# Patient Record
Sex: Female | Born: 1980 | Race: Black or African American | Hispanic: No | Marital: Married | Smoking: Current every day smoker
Health system: Southern US, Community
[De-identification: ages and names within clinical notes are randomized; demographics above are authoritative.]

## PROBLEM LIST (undated history)

## (undated) DIAGNOSIS — L0100 Impetigo, unspecified: Secondary | ICD-10-CM

---

## 2000-10-17 ENCOUNTER — Emergency Department (HOSPITAL_COMMUNITY): Admission: EM | Admit: 2000-10-17 | Discharge: 2000-10-17 | Payer: Self-pay | Admitting: Emergency Medicine

## 2009-09-26 ENCOUNTER — Emergency Department (HOSPITAL_COMMUNITY): Admission: EM | Admit: 2009-09-26 | Discharge: 2009-09-26 | Payer: Self-pay | Admitting: Emergency Medicine

## 2010-12-28 ENCOUNTER — Ambulatory Visit (INDEPENDENT_AMBULATORY_CARE_PROVIDER_SITE_OTHER): Payer: Self-pay

## 2010-12-28 ENCOUNTER — Inpatient Hospital Stay (INDEPENDENT_AMBULATORY_CARE_PROVIDER_SITE_OTHER)
Admission: RE | Admit: 2010-12-28 | Discharge: 2010-12-28 | Disposition: A | Discharge status: Home / Self Care | Payer: Self-pay | Source: Ambulatory Visit | Attending: Family Medicine | Admitting: Family Medicine

## 2010-12-28 ENCOUNTER — Emergency Department (HOSPITAL_COMMUNITY)
Admission: EM | Admit: 2010-12-28 | Discharge: 2010-12-28 | Disposition: A | Discharge status: Home / Self Care | Payer: Self-pay | Attending: Emergency Medicine | Admitting: Emergency Medicine

## 2010-12-28 DIAGNOSIS — R5383 Other fatigue: Secondary | ICD-10-CM | POA: Insufficient documentation

## 2010-12-28 DIAGNOSIS — K5900 Constipation, unspecified: Secondary | ICD-10-CM

## 2010-12-28 DIAGNOSIS — R599 Enlarged lymph nodes, unspecified: Secondary | ICD-10-CM

## 2010-12-28 DIAGNOSIS — R5381 Other malaise: Secondary | ICD-10-CM | POA: Insufficient documentation

## 2010-12-28 DIAGNOSIS — R197 Diarrhea, unspecified: Secondary | ICD-10-CM | POA: Insufficient documentation

## 2010-12-28 DIAGNOSIS — R634 Abnormal weight loss: Secondary | ICD-10-CM | POA: Insufficient documentation

## 2010-12-28 DIAGNOSIS — R61 Generalized hyperhidrosis: Secondary | ICD-10-CM | POA: Insufficient documentation

## 2010-12-28 DIAGNOSIS — R109 Unspecified abdominal pain: Secondary | ICD-10-CM | POA: Insufficient documentation

## 2010-12-28 LAB — CBC
HCT: 36.3 % (ref 36.0–46.0)
MCHC: 33.1 g/dL (ref 30.0–36.0)
MCV: 90.1 fL (ref 78.0–100.0)
Platelets: 184 10*3/uL (ref 150–400)
RDW: 13 % (ref 11.5–15.5)
WBC: 6.8 10*3/uL (ref 4.0–10.5)

## 2010-12-28 LAB — DIFFERENTIAL
Eosinophils Absolute: 0.2 10*3/uL (ref 0.0–0.7)
Eosinophils Relative: 3 % (ref 0–5)
Lymphocytes Relative: 30 % (ref 12–46)
Lymphs Abs: 2.1 10*3/uL (ref 0.7–4.0)
Monocytes Absolute: 0.5 10*3/uL (ref 0.1–1.0)

## 2010-12-31 LAB — URINE CULTURE: Colony Count: 75000

## 2010-12-31 LAB — URINALYSIS, ROUTINE W REFLEX MICROSCOPIC
Bilirubin Urine: NEGATIVE
Ketones, ur: NEGATIVE mg/dL
Nitrite: NEGATIVE
Protein, ur: NEGATIVE mg/dL
Specific Gravity, Urine: 1.014 (ref 1.005–1.030)
Urobilinogen, UA: 0.2 mg/dL (ref 0.0–1.0)

## 2010-12-31 LAB — POCT PREGNANCY, URINE: Preg Test, Ur: NEGATIVE

## 2010-12-31 LAB — URINE MICROSCOPIC-ADD ON

## 2017-01-22 ENCOUNTER — Emergency Department (HOSPITAL_COMMUNITY)
Admission: EM | Admit: 2017-01-22 | Discharge: 2017-01-22 | Disposition: A | Discharge status: Home / Self Care | Payer: Self-pay | Attending: Emergency Medicine | Admitting: Emergency Medicine

## 2017-01-22 ENCOUNTER — Encounter (HOSPITAL_COMMUNITY): Payer: Self-pay | Admitting: Emergency Medicine

## 2017-01-22 DIAGNOSIS — R198 Other specified symptoms and signs involving the digestive system and abdomen: Secondary | ICD-10-CM

## 2017-01-22 DIAGNOSIS — F17210 Nicotine dependence, cigarettes, uncomplicated: Secondary | ICD-10-CM | POA: Insufficient documentation

## 2017-01-22 DIAGNOSIS — R194 Change in bowel habit: Secondary | ICD-10-CM | POA: Insufficient documentation

## 2017-01-22 DIAGNOSIS — L0100 Impetigo, unspecified: Secondary | ICD-10-CM | POA: Insufficient documentation

## 2017-01-22 HISTORY — DX: Impetigo, unspecified: L01.00

## 2017-01-22 LAB — POC OCCULT BLOOD, ED: FECAL OCCULT BLD: NEGATIVE

## 2017-01-22 MED ORDER — DOXYCYCLINE HYCLATE 100 MG PO CAPS: 100.0000 mg | ORAL_CAPSULE | Freq: Two times a day (BID) | ORAL | 0 refills | Status: AC

## 2017-01-22 MED ORDER — MUPIROCIN CALCIUM 2 % NA OINT: TOPICAL_OINTMENT | NASAL | 0 refills | Status: AC

## 2017-01-22 NOTE — ED Triage Notes (Signed)
Pt states rash on left side of nare and states same rash to buttocks.  Pt states hx of impetigo and "this feels similar'

## 2017-01-22 NOTE — ED Provider Notes (Signed)
[facility]-EMERGENCY DEPT Provider Note   CSN: [id number] Arrival date & time: 01/22/17  1220   By signing my name below, I, Tina Doyle, attest that this documentation has been prepared under the direction and in the presence of Sharen Heck, PA-C. Electronically Signed: Clarisse Doyle, Scribe. 01/22/17. 5:05 PM.   History   Chief Complaint Chief Complaint  Patient presents with  . Rash   The history is provided by the patient and medical records. No language interpreter was used.    Tina Doyle is a 36 y.o. female with h/o impetigo, transported via private vehicle to the Emergency Department with concern for recurring red, non-raised areas of skin onset 2 days ago. Pt notes affected area of skin to the L side of the nose described as itchy, buring and tingly. She further states the first flare up was in 2009, successfully treated with abx and ointments and recurring approx every 9 months; last flare up ~2 years ago. Pt notes current affected area of skin is consistent with past impetigo symptoms. No rhinorrhea, other medical disorders or daily medications. No PCP; no dermatologist.  She also expresses concern for a painful discomfort in the rectum that feels "wiggly", as though something is moving around inside it. She also reports a white, elastic "worm" "coiled" up her stool this morning. No N/V/D, bloody stool, mucus in stool, difficulty urinating, pelvic pain, unexpected weight loss, sick contacts, recent travel, suspicious food intake.  No known household members with similar symptoms. No noctural anal itching.  Currently on menstrual cycle. On depo x 2 months.  Past Medical History:  Diagnosis Date  . Impetigo     There are no active problems to display for this patient.   History reviewed. No pertinent surgical history.  OB History    No data available       Home Medications    Prior to Admission medications   Medication Sig Start Date End Date Taking? Authorizing  Provider  doxycycline (VIBRAMYCIN) 100 MG capsule Take 1 capsule (100 mg total) by mouth 2 (two) times daily. 01/22/17 01/27/17  Liberty Handy, PA-C  mupirocin nasal ointment (BACTROBAN) 2 % Apply in each nostril daily 01/22/17 01/27/17  Liberty Handy, PA-C    Family History No family history on file.  Social History Social History  Substance Use Topics  . Smoking status: Current Every Day Smoker    Types: Cigarettes  . Smokeless tobacco: Never Used  . Alcohol use No     Allergies   Morphine and related   Review of Systems Review of Systems  HENT: Negative for rhinorrhea.   Gastrointestinal: Positive for rectal pain. Negative for abdominal pain, blood in stool, constipation, diarrhea, nausea and vomiting.  Genitourinary: Negative for difficulty urinating and pelvic pain.  Skin: Positive for rash.  All other systems reviewed and are negative.    Physical Exam Updated Vital Signs BP 116/81 (BP Location: Left Arm)   Pulse 81   Temp 99.1 F (37.3 C) (Oral)   Resp 16   LMP 01/17/2017   SpO2 100%   Physical Exam  Constitutional: She is oriented to person, place, and time. She appears well-developed and well-nourished.  HENT:  Head: Normocephalic and atraumatic.  Eyes: Conjunctivae are normal. Pupils are equal, round, and reactive to light. Right eye exhibits no discharge. Left eye exhibits no discharge. No scleral icterus.  Neck: Normal range of motion. No JVD present. No tracheal deviation present.  Pulmonary/Chest: Effort normal. No stridor.  Genitourinary:  Pelvic exam was performed with patient in the knee-chest position.  Genitourinary Comments: Chaperone was present.  Patient with appropriate discomfort in perianal/rectal area with palpation.  There are no external fissures or external hemorrhoids noted. No induration or swelling of the perianal skin.   I was able to feel the first 3-4cm of the rectum digitally without gross abnormalities or masses.  Stool  color is brown with no gross blood noted.   No signs of perirectal abscess.   DRE reveals good sphincter tone.    Neurological: She is alert and oriented to person, place, and time. Coordination normal.  Skin: Rash noted.  Tender, mildly erythematous, moist rash to the L nare with honey crusting; no surrounding signs of cellulitis of abscess; bilateral nasal passages normal  Psychiatric: She has a normal mood and affect. Her behavior is normal. Judgment and thought content normal.  Nursing note and vitals reviewed.    ED Treatments / Results  DIAGNOSTIC STUDIES: Oxygen Saturation is 100% on RA, NL by my interpretation.    COORDINATION OF CARE: 2:22 PM-Discussed next steps with pt. Pt verbalized understanding and is agreeable with the plan. Rectal exam performed with female chaperone present. Will order medications and labs, then prepare for d/c.   Labs (all labs ordered are listed, but only abnormal results are displayed) Labs Reviewed  POC OCCULT BLOOD, ED    EKG  EKG Interpretation Doyle       Radiology No results found.  Procedures Procedures (including critical care time)  Medications Ordered in ED Medications - No data to display   Initial Impression / Assessment and Plan / ED Course  I have reviewed the triage vital signs and the nursing notes.  Pertinent labs & imaging results that were available during my care of the patient were reviewed by me and considered in my medical decision making (see chart for details).  Clinical Course as of Jan 23 1704  Wed Jan 22, 2017  1442 Fecal Occult Blood, POC: NEGATIVE [CG]  1442 Temp: 99.1 F (37.3 C) [CG]  1442 Pulse Rate: 81 [CG]  1442 Resp: 16 [CG]  1442 SpO2: 100 % [CG]    Clinical Course User Index [CG] Liberty Handy, PA-C   36 year old healthy female presents with two concerns.   Patient reports burning rash to the left nare similar to her previous episodes of impetigo. Patient started having these  outbreaks in 2009 and gets them every 9-10 months, in the same area, symptoms usually resolve with oral and topical antibiotics. On exam vital signs are within normal limits without fever patient is well-appearing. There is a tiny area of erythematous, moist rash with honey crust covering consistent with impetigo. No surrounding signs of cellulitis or abscess. Will discharge patient with oral antibiotic and ointment as these have worked for her in the past. Home care instructions given.  Patient also reported concern with recent bowel movement this AM.  She reports seeing a "white worm" in her stool this morning. Patient describes this as a white, string-like, coiled worm approximately 10-12 cm long. She also reports anal discomfort described as "something wiggling in there".  No recent travel, exposure to suspicious food, contact with patients with similar symptoms. No bloody or mucoid BMs. No abdominal pain, n/v/d/c.  No nocturnal anal pruritus.  On exam abdomen is benign. Digital rectal exam did not reveal any abnormalities. Hemoccult negative for blood. Unclear etiology to this presentation. Patient is completely asymptomatic otherwise without any systemic symptoms that would raise  concern or require emergent lab work, imaging or admission immediately. I discussed medical decision-making with patient, I recommended that she establish care with a primary care provider in the area and that she makes an appointment for further discussion of her symptoms. Patient was advised to monitor her BMs and to look for any additional signs of abnormalities. Patient likely needs a stool sample and additional workup to rule out parasites. Patient does not have a primary care provider health insurance, I provided her information for [facility] so she is able to establish care with a primary care provider there. Patient verbalized understanding and is agreeable to plan. Patient will try to call clinic today to  make an appointment in the next 1-2 days for further evaluation. Strict ED return precautions given. Patient is aware of red flag symptoms to monitor for that would warrant return to the emergency department.  Patient, ED treatment and discharge plan was discussed with supervising physician who is agreeable with plan.   Final Clinical Impressions(s) / ED Diagnoses   Final diagnoses:  Impetigo  Abnormal bowel movement    New Prescriptions Discharge Medication List as of 01/22/2017  3:32 PM    START taking these medications   Details  doxycycline (VIBRAMYCIN) 100 MG capsule Take 1 capsule (100 mg total) by mouth 2 (two) times daily., Starting Wed 01/22/2017, Until Mon 01/27/2017, Print    mupirocin nasal ointment (BACTROBAN) 2 % Apply in each nostril daily, Print       I personally performed the services described in this documentation, which was scribed in my presence. The recorded information has been reviewed and is accurate.    Liberty Handy, PA-C 01/22/17 1706    Alvira Monday, MD 01/22/17 [phone]

## 2017-01-22 NOTE — ED Notes (Signed)
Pt c/o recurring sore on left nare. Also states has a sore on buttock. States when had a BM this am, "there was a worm in it".

## 2017-01-22 NOTE — Discharge Instructions (Signed)
We will treat your left sided nose rash with an antibiotic ointment and an oral antibiotic. Please take these medications as prescribed. Avoid itching, scratching or manipulating the area of the rash to avoid further conversation with bacteria. Monitor for signs of worsening infection that would suggest cellulitis including increased redness, pain, fevers.  We performed a rectal exam today which was normal and did not show any blood. Given your one time episode of abnormal bowel movement, we will refrain from further testing today as no lab work or testing will  results within 24 hours. Please monitor your symptoms. Return to the emergency department if you develop fever, abdominal pain, bloody BMs, BMs with posterior mucus, nausea or vomiting. You need to contact a primary care provider in the area to further discuss your symptoms. I recommend that you contact [facility] today and make an appointment as soon as possible for an evaluation.

## 2020-07-10 ENCOUNTER — Other Ambulatory Visit: Payer: Self-pay | Admitting: Physician Assistant

## 2020-07-10 ENCOUNTER — Other Ambulatory Visit: Payer: Self-pay | Admitting: Emergency Medicine

## 2020-07-10 DIAGNOSIS — R1031 Right lower quadrant pain: Secondary | ICD-10-CM

## 2020-07-12 ENCOUNTER — Ambulatory Visit
Admission: RE | Admit: 2020-07-12 | Discharge: 2020-07-12 | Disposition: A | Discharge status: Home / Self Care | Payer: Self-pay | Source: Ambulatory Visit | Attending: Physician Assistant | Admitting: Physician Assistant

## 2020-07-12 DIAGNOSIS — R1031 Right lower quadrant pain: Secondary | ICD-10-CM

## 2021-12-09 IMAGING — US US PELVIS COMPLETE WITH TRANSVAGINAL
1 series · 13 of 25 positions shown · non-contrast
Comparison: None

CLINICAL DATA: Right lower quadrant pain for 9 months.



[Series 1: us pelvis complete with transvaginal · 0.24mm/px · 101 acquisitions, 13 frames shown]
[im 1/101]
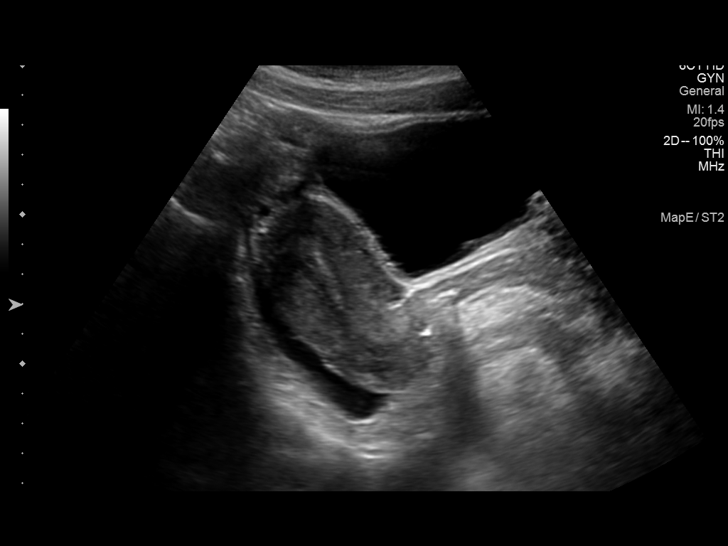
[im 9/101]
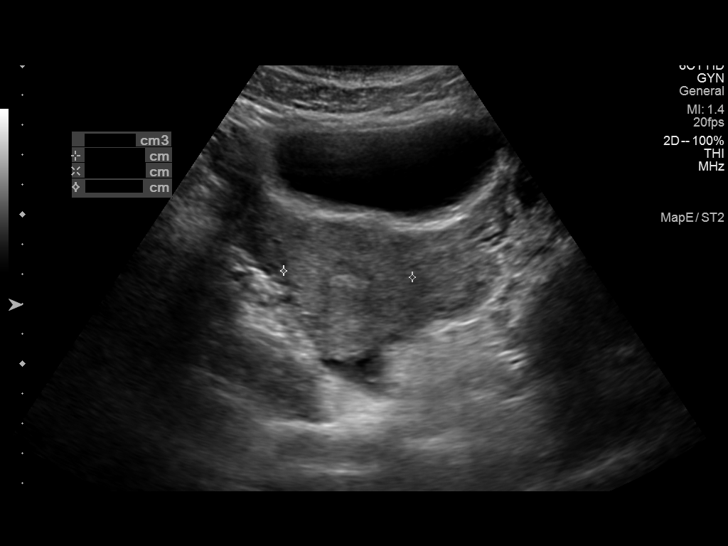
[im 17/101]
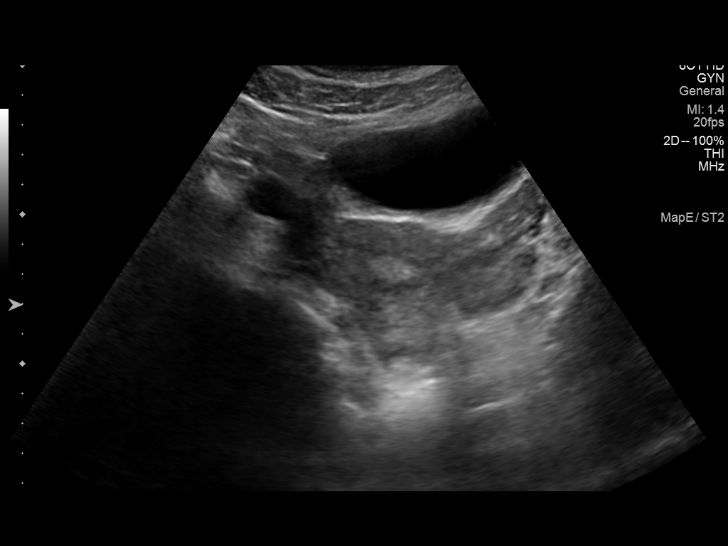
[im 26/101]
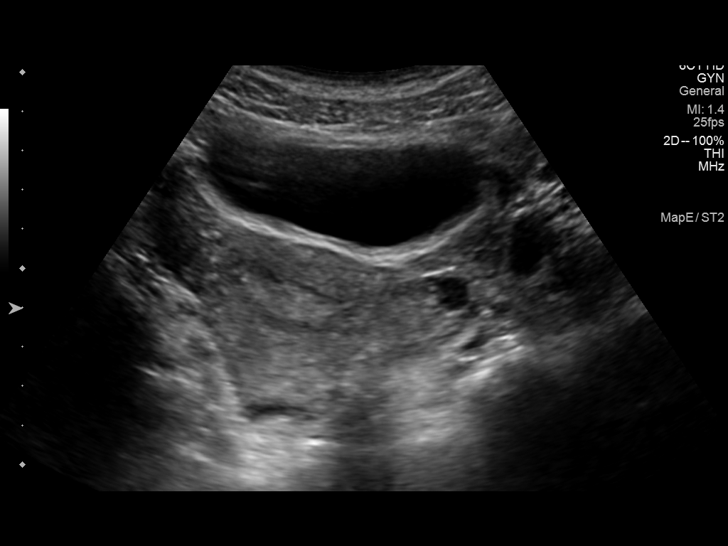
[im 34/101]
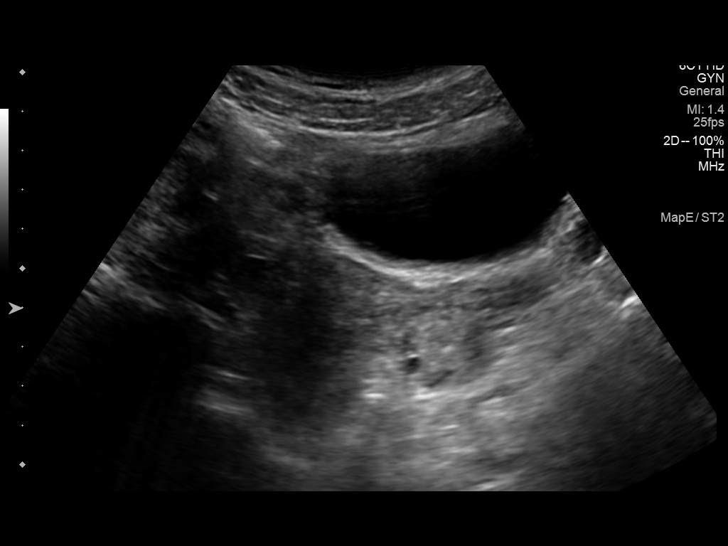
[im 42/101]
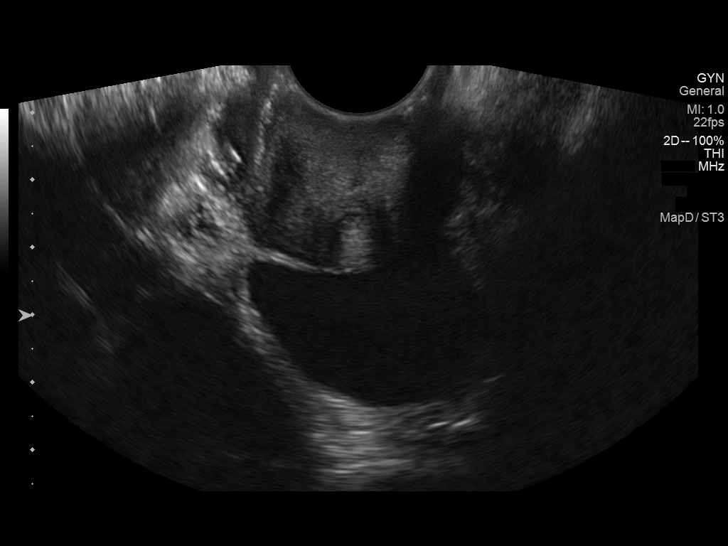
[im 51/101]
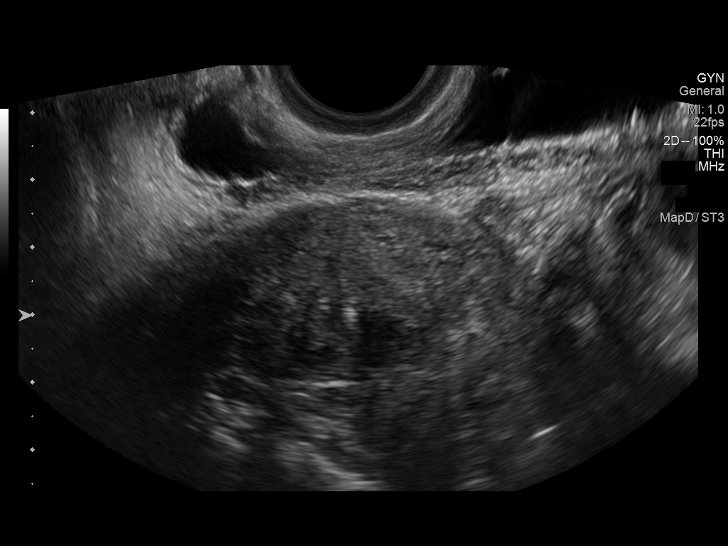
[im 59/101]
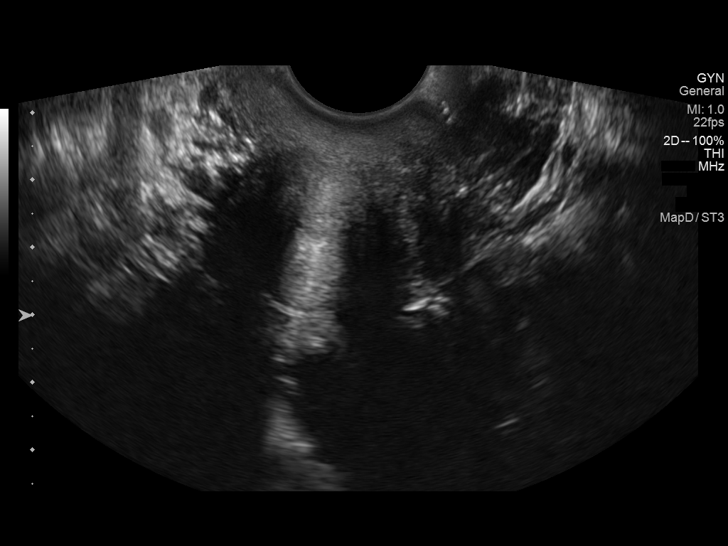
[im 67/101]
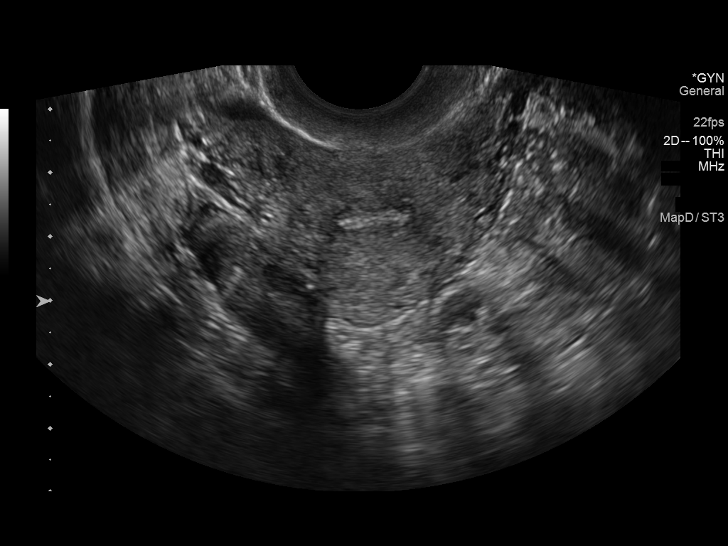
[im 76/101]
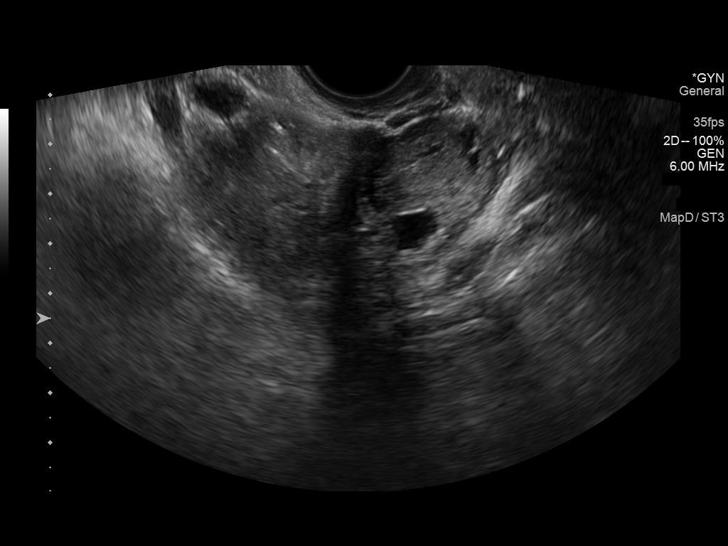
[im 84/101]
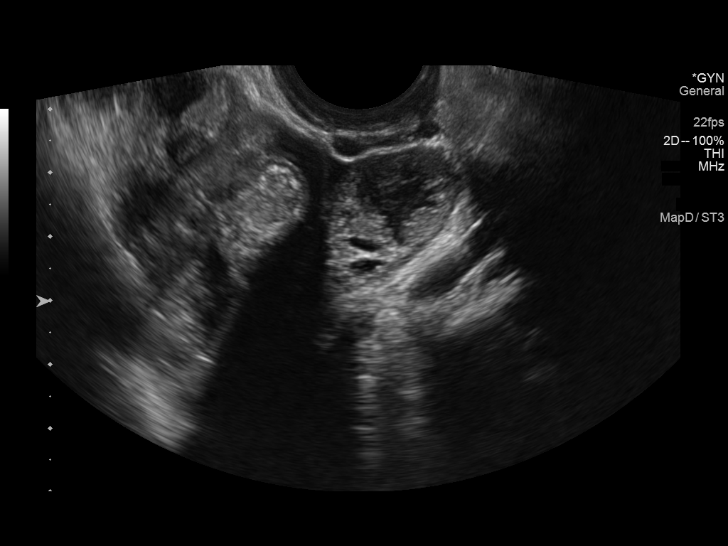
[im 92/101]
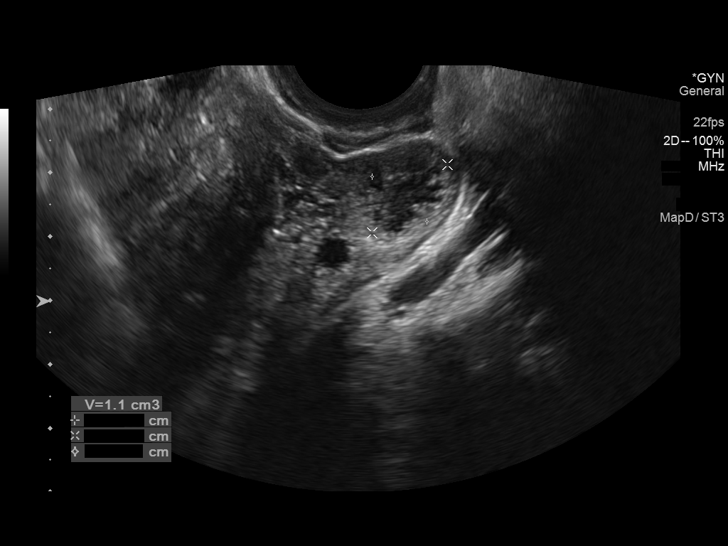
[im 101/101]
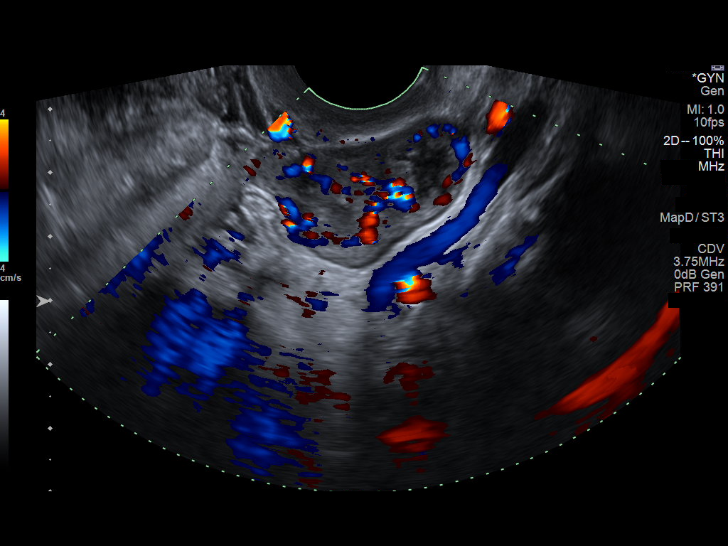

[13 of 25 positions shown; findings below may reference images not displayed]

FINDINGS: Uterus

Measurements: 7.1 x 3.9 x 4.8 cm = volume: 69 mL. No fibroids or
other mass visualized.

Endometrium

Thickness: 7 mm.  No focal abnormality visualized.

Right ovary

Measurements: 3.4 x 1.3 x 0.8 cm = volume: 1.8 mL. Normal
appearance/no adnexal mass.

Left ovary

Measurements: 3.5 x 2.1 x 1.8 cm = volume: 6.7 mL. Two complex
regions in the ovary measuring 1.6 and 1.5 cm which both appear
cystic centrally with thickened, collapsing walls. There is
prominent vascularity surrounding both regions.

Other findings

Small volume pelvic free fluid.
IMPRESSION: 1. Two small complex regions in the left ovary favored to represent
involuting corpora lutea.
2. Unremarkable appearance of the right ovary and uterus.
3. Small volume pelvic free fluid.
